# Patient Record
Sex: Female | Born: 1940 | Race: Black or African American | Hispanic: No | Marital: Married | Smoking: Never smoker
Health system: Southern US, Community
[De-identification: ages and names within clinical notes are randomized; demographics above are authoritative.]

## PROBLEM LIST (undated history)

## (undated) DIAGNOSIS — Z8669 Personal history of other diseases of the nervous system and sense organs: Secondary | ICD-10-CM

## (undated) DIAGNOSIS — E278 Other specified disorders of adrenal gland: Secondary | ICD-10-CM

## (undated) DIAGNOSIS — H409 Unspecified glaucoma: Secondary | ICD-10-CM

## (undated) DIAGNOSIS — E785 Hyperlipidemia, unspecified: Secondary | ICD-10-CM

## (undated) DIAGNOSIS — R7303 Prediabetes: Secondary | ICD-10-CM

## (undated) DIAGNOSIS — K219 Gastro-esophageal reflux disease without esophagitis: Secondary | ICD-10-CM

## (undated) DIAGNOSIS — E559 Vitamin D deficiency, unspecified: Secondary | ICD-10-CM

## (undated) DIAGNOSIS — E039 Hypothyroidism, unspecified: Secondary | ICD-10-CM

## (undated) DIAGNOSIS — E279 Disorder of adrenal gland, unspecified: Secondary | ICD-10-CM

## (undated) DIAGNOSIS — I10 Essential (primary) hypertension: Secondary | ICD-10-CM

## (undated) HISTORY — PX: ABDOMINAL HYSTERECTOMY: SHX81

## (undated) HISTORY — PX: THYROIDECTOMY: SHX17

## (undated) HISTORY — PX: EYE SURGERY: SHX253

## (undated) HISTORY — PX: CYSTECTOMY W/ CONTINENT DIVERSION: SUR360

## (undated) HISTORY — PX: BREAST BIOPSY: SHX20

---

## 2019-07-27 ENCOUNTER — Other Ambulatory Visit: Payer: Self-pay | Admitting: Infectious Diseases

## 2019-07-27 DIAGNOSIS — Z1231 Encounter for screening mammogram for malignant neoplasm of breast: Secondary | ICD-10-CM

## 2019-09-19 ENCOUNTER — Ambulatory Visit: Payer: Self-pay

## 2019-10-07 ENCOUNTER — Ambulatory Visit: Payer: Self-pay

## 2019-10-13 ENCOUNTER — Encounter: Payer: Self-pay | Admitting: Radiology

## 2019-10-13 ENCOUNTER — Ambulatory Visit
Admission: RE | Admit: 2019-10-13 | Discharge: 2019-10-13 | Disposition: A | Discharge status: Home / Self Care | Payer: Medicare Other | Source: Ambulatory Visit | Attending: Infectious Diseases | Admitting: Infectious Diseases

## 2019-10-13 DIAGNOSIS — Z1231 Encounter for screening mammogram for malignant neoplasm of breast: Secondary | ICD-10-CM | POA: Insufficient documentation

## 2019-10-21 ENCOUNTER — Ambulatory Visit: Payer: Medicare Other | Attending: Internal Medicine

## 2019-10-21 DIAGNOSIS — Z23 Encounter for immunization: Secondary | ICD-10-CM

## 2019-10-21 NOTE — Progress Notes (Signed)
   Covid-19 Vaccination Clinic  Name:  Ayriana Kladis    MRN: [id number] DOB: 1941/02/24  10/21/2019  Ms. Rosasco was observed post Covid-19 immunization for 15 minutes without incident. She was provided with Vaccine Information Sheet and instruction to access the V-Safe system.   Ms. Hartl was instructed to call 911 with any severe reactions post vaccine: Marland Kitchen Difficulty breathing  . Swelling of face and throat  . A fast heartbeat  . A bad rash all over body  . Dizziness and weakness   Immunizations Administered    Name Date Dose VIS Date Route   Pfizer COVID-19 Vaccine 10/21/2019  8:11 AM 0.3 mL 08/10/2018 Intramuscular   Manufacturer: [place]   Lot: [id number]   [place]: [id number]

## 2019-11-15 ENCOUNTER — Ambulatory Visit: Payer: Medicare Other | Attending: Internal Medicine

## 2019-11-15 DIAGNOSIS — Z23 Encounter for immunization: Secondary | ICD-10-CM

## 2019-11-15 NOTE — Progress Notes (Signed)
   Covid-19 Vaccination Clinic  Name:  Jaime Webster    MRN: [id number] DOB: 05-29-1941  11/15/2019  [name] was observed post Covid-19 immunization for 15 minutes without incident. She was provided with Vaccine Information Sheet and instruction to access the V-Safe system.   [name] was instructed to call 911 with any severe reactions post vaccine: Marland Kitchen Difficulty breathing  . Swelling of face and throat  . A fast heartbeat  . A bad rash all over body  . Dizziness and weakness   Immunizations Administered    Name Date Dose VIS Date Route   Pfizer COVID-19 Vaccine 11/15/2019  8:05 AM 0.3 mL 08/10/2018 Intramuscular   Manufacturer: [place]   Lot: [id number]   [place]: [id number]

## 2020-01-09 ENCOUNTER — Other Ambulatory Visit
Admission: RE | Admit: 2020-01-09 | Discharge: 2020-01-09 | Disposition: A | Discharge status: Home / Self Care | Payer: Medicare Other | Source: Ambulatory Visit | Attending: Internal Medicine | Admitting: Internal Medicine

## 2020-01-09 ENCOUNTER — Other Ambulatory Visit: Payer: Self-pay

## 2020-01-09 DIAGNOSIS — Z01812 Encounter for preprocedural laboratory examination: Secondary | ICD-10-CM | POA: Insufficient documentation

## 2020-01-09 DIAGNOSIS — Z20822 Contact with and (suspected) exposure to covid-19: Secondary | ICD-10-CM | POA: Diagnosis not present

## 2020-01-10 ENCOUNTER — Encounter: Payer: Self-pay | Admitting: Internal Medicine

## 2020-01-10 LAB — SARS CORONAVIRUS 2 (TAT 6-24 HRS): SARS Coronavirus 2: NEGATIVE

## 2020-01-11 ENCOUNTER — Encounter: Payer: Self-pay | Admitting: Internal Medicine

## 2020-01-11 ENCOUNTER — Ambulatory Visit: Payer: Medicare Other | Admitting: Anesthesiology

## 2020-01-11 ENCOUNTER — Other Ambulatory Visit: Payer: Self-pay

## 2020-01-11 ENCOUNTER — Ambulatory Visit
Admission: RE | Admit: 2020-01-11 | Discharge: 2020-01-11 | Disposition: A | Discharge status: Home / Self Care | Payer: Medicare Other | Attending: Internal Medicine | Admitting: Internal Medicine

## 2020-01-11 ENCOUNTER — Encounter
Admission: RE | Disposition: A | Discharge status: Home / Self Care | Payer: Self-pay | Source: Home / Self Care | Attending: Internal Medicine

## 2020-01-11 DIAGNOSIS — E785 Hyperlipidemia, unspecified: Secondary | ICD-10-CM | POA: Diagnosis not present

## 2020-01-11 DIAGNOSIS — H42 Glaucoma in diseases classified elsewhere: Secondary | ICD-10-CM | POA: Insufficient documentation

## 2020-01-11 DIAGNOSIS — Z888 Allergy status to other drugs, medicaments and biological substances status: Secondary | ICD-10-CM | POA: Insufficient documentation

## 2020-01-11 DIAGNOSIS — E039 Hypothyroidism, unspecified: Secondary | ICD-10-CM | POA: Insufficient documentation

## 2020-01-11 DIAGNOSIS — E1139 Type 2 diabetes mellitus with other diabetic ophthalmic complication: Secondary | ICD-10-CM | POA: Diagnosis not present

## 2020-01-11 DIAGNOSIS — I10 Essential (primary) hypertension: Secondary | ICD-10-CM | POA: Insufficient documentation

## 2020-01-11 DIAGNOSIS — Z1211 Encounter for screening for malignant neoplasm of colon: Secondary | ICD-10-CM | POA: Insufficient documentation

## 2020-01-11 DIAGNOSIS — E559 Vitamin D deficiency, unspecified: Secondary | ICD-10-CM | POA: Diagnosis not present

## 2020-01-11 DIAGNOSIS — D124 Benign neoplasm of descending colon: Secondary | ICD-10-CM | POA: Diagnosis not present

## 2020-01-11 DIAGNOSIS — K219 Gastro-esophageal reflux disease without esophagitis: Secondary | ICD-10-CM | POA: Insufficient documentation

## 2020-01-11 DIAGNOSIS — Z79899 Other long term (current) drug therapy: Secondary | ICD-10-CM | POA: Insufficient documentation

## 2020-01-11 HISTORY — DX: Gastro-esophageal reflux disease without esophagitis: K21.9

## 2020-01-11 HISTORY — DX: Hypothyroidism, unspecified: E03.9

## 2020-01-11 HISTORY — PX: COLONOSCOPY WITH PROPOFOL: SHX5780

## 2020-01-11 HISTORY — DX: Hyperlipidemia, unspecified: E78.5

## 2020-01-11 HISTORY — DX: Unspecified glaucoma: H40.9

## 2020-01-11 HISTORY — DX: Disorder of adrenal gland, unspecified: E27.9

## 2020-01-11 HISTORY — DX: Essential (primary) hypertension: I10

## 2020-01-11 HISTORY — DX: Other specified disorders of adrenal gland: E27.8

## 2020-01-11 HISTORY — DX: Personal history of other diseases of the nervous system and sense organs: Z86.69

## 2020-01-11 HISTORY — DX: Vitamin D deficiency, unspecified: E55.9

## 2020-01-11 HISTORY — DX: Prediabetes: R73.03

## 2020-01-11 SURGERY — COLONOSCOPY WITH PROPOFOL
Anesthesia: General

## 2020-01-11 MED ORDER — SODIUM CHLORIDE 0.9 % IV SOLN: INTRAVENOUS | Status: DC

## 2020-01-11 MED ORDER — PROPOFOL 500 MG/50ML IV EMUL
INTRAVENOUS | Status: DC | PRN
  Administered 2020-01-11: 150 ug/kg/min via INTRAVENOUS

## 2020-01-11 MED ORDER — PROPOFOL 500 MG/50ML IV EMUL
INTRAVENOUS | Status: AC
  Filled 2020-01-11: qty 50

## 2020-01-11 NOTE — Anesthesia Postprocedure Evaluation (Signed)
Anesthesia Post Note  Patient: Jaime Webster  Procedure(s) Performed: COLONOSCOPY WITH PROPOFOL (N/A )  Patient location during evaluation: Endoscopy Anesthesia Type: General Level of consciousness: awake and alert Pain management: pain level controlled Vital Signs Assessment: post-procedure vital signs reviewed and stable Respiratory status: spontaneous breathing, nonlabored ventilation, respiratory function stable and patient connected to nasal cannula oxygen Cardiovascular status: blood pressure returned to baseline and stable Postop Assessment: no apparent nausea or vomiting Anesthetic complications: no   No complications documented.   Last Vitals:  Vitals:   01/11/20 1313 01/11/20 1323  BP: 117/71 (!) 162/62  Pulse: 63 51  Resp: 15 14  Temp:    SpO2: 100% 100%    Last Pain:  Vitals:   01/11/20 1323  TempSrc:   PainSc: 0-No pain                 Martha Clan

## 2020-01-11 NOTE — Anesthesia Procedure Notes (Signed)
Performed by: Cook-Martin, Ethyle Tiedt Pre-anesthesia Checklist: Patient identified, Emergency Drugs available, Suction available, Patient being monitored and Timeout performed Patient Re-evaluated:Patient Re-evaluated prior to induction Oxygen Delivery Method: Nasal cannula Preoxygenation: Pre-oxygenation with 100% oxygen Induction Type: IV induction Placement Confirmation: positive ETCO2 and CO2 detector       

## 2020-01-11 NOTE — Op Note (Signed)
[facility] Gastroenterology Patient Name: Jaime Webster Procedure Date: 01/11/2020 12:42 PM MRN: [id number] Account #: [id number] Date of Birth: February 15, 1941 Admit Type: Outpatient Age: 79 Room: [facility] ENDO ROOM 3 Gender: Female Note Status: Finalized Procedure:             Colonoscopy Indications:           Screening for colorectal malignant neoplasm Providers:             Benay Pike. Alice Reichert MD, MD Referring MD:          Adrian Prows (Referring MD) Medicines:             Propofol per Anesthesia Complications:         No immediate complications. Procedure:             Pre-Anesthesia Assessment:                        - The risks and benefits of the procedure and the                         sedation options and risks were discussed with the                         patient. All questions were answered and informed                         consent was obtained.                        - Patient identification and proposed procedure were                         verified prior to the procedure by the nurse. The                         procedure was verified in the procedure room.                        - ASA Grade Assessment: III - A patient with severe                         systemic disease.                        - After reviewing the risks and benefits, the patient                         was deemed in satisfactory condition to undergo the                         procedure.                        After obtaining informed consent, the colonoscope was                         passed under direct vision. Throughout the procedure,                         the patient's blood pressure, pulse, and  oxygen                         saturations were monitored continuously. The                         Colonoscope was introduced through the anus and                         advanced to the the cecum, identified by appendiceal                         orifice and ileocecal valve.  The colonoscopy was                         performed without difficulty. The patient tolerated                         the procedure well. The quality of the bowel                         preparation was excellent. The ileocecal valve,                         appendiceal orifice, and rectum were photographed. Findings:      The perianal and digital rectal examinations were normal. Pertinent       negatives include normal sphincter tone and no palpable rectal lesions.      A 3 mm polyp was found in the descending colon. The polyp was sessile.       The polyp was removed with a cold biopsy forceps. Resection and       retrieval were complete.      The exam was otherwise without abnormality on direct and retroflexion       views. Impression:            - One 3 mm polyp in the descending colon, removed with                         a cold biopsy forceps. Resected and retrieved.                        - The examination was otherwise normal on direct and                         retroflexion views. Recommendation:        - Patient has a contact number available for                         emergencies. The signs and symptoms of potential                         delayed complications were discussed with the patient.                         Return to normal activities tomorrow. Written                         discharge instructions were provided to the patient.                        -  Resume previous diet.                        - Continue present medications.                        - Repeat colonoscopy is recommended for surveillance.                         The colonoscopy date will be determined after                         pathology results from today's exam become available                         for review.                        - Return to GI office PRN.                        - The findings and recommendations were discussed with                         the patient. Procedure  Code(s):     --- Professional ---                        [phone], Colonoscopy, flexible; with biopsy, single or                         multiple Diagnosis Code(s):     --- Professional ---                        K63.5, Polyp of colon                        Z12.11, Encounter for screening for malignant neoplasm                         of colon CPT copyright 2019 American Medical Association. All rights reserved. The codes documented in this report are preliminary and upon coder review may  be revised to meet current compliance requirements. Efrain Sella MD, MD 01/11/2020 1:00:46 PM This report has been signed electronically. Number of Addenda: 0 Note Initiated On: 01/11/2020 12:42 PM Scope Withdrawal Time: 0 hours 7 minutes 14 seconds  Total Procedure Duration: 0 hours 10 minutes 43 seconds  Estimated Blood Loss:  Estimated blood loss: none.      [facility]

## 2020-01-11 NOTE — Interval H&P Note (Signed)
History and Physical Interval Note:  01/11/2020 12:26 PM  Jaime Webster  has presented today for surgery, with the diagnosis of [place].  The various methods of treatment have been discussed with the patient and family. After consideration of risks, benefits and other options for treatment, the patient has consented to  Procedure(s): COLONOSCOPY WITH PROPOFOL (N/A) as a surgical intervention.  The patient's history has been reviewed, patient examined, no change in status, stable for surgery.  I have reviewed the patient's chart and labs.  Questions were answered to the patient's satisfaction.     [place], [place]

## 2020-01-11 NOTE — H&P (Signed)
Outpatient short stay form Pre-procedure 01/11/2020 12:26 PM Jiles Goya K. Alice Reichert, M.D.  Primary Physician: Adrian Prows, M.D.  Reason for visit:  Colon cancer screening  History of present illness: Patient presents for colonoscopy for colon cancer screening. The patient denies complaints of abdominal pain, significant change in bowel habits, or rectal bleeding.      Current Facility-Administered Medications:  .  0.9 %  sodium chloride infusion, , Intravenous, Continuous, [place], Benay Pike, MD, Last Rate: 20 mL/hr at 01/11/20 1215, Continued from Pre-op at 01/11/20 1215  Medications Prior to Admission  Medication Sig Dispense Refill Last Dose  . albuterol (VENTOLIN HFA) 108 (90 Base) MCG/ACT inhaler Inhale 1 puff into the lungs every 6 (six) hours as needed for wheezing or shortness of breath.   Past Month at Unknown time  . ascorbic acid (VITAMIN C) 500 MG tablet Take 500 mg by mouth daily.   Past Week at Unknown time  . aspirin EC 81 MG tablet Take 81 mg by mouth daily. Swallow whole.   Past Week at Unknown time  . cholecalciferol (VITAMIN D3) 10 MCG (400 UNIT) TABS tablet Take 400 Units by mouth daily.   Past Week at Unknown time  . dorzolamide-timolol (COSOPT) 22.3-6.8 MG/ML ophthalmic solution Place 1 drop into both eyes 2 (two) times daily.   01/10/2020 at 0800  . hydrochlorothiazide (HYDRODIURIL) 25 MG tablet Take 25 mg by mouth daily.   01/11/2020 at 0700  . latanoprost (XALATAN) 0.005 % ophthalmic solution Place 1 drop into both eyes at bedtime.   01/10/2020 at Unknown time  . levothyroxine (SYNTHROID) 150 MCG tablet Take 150 mcg by mouth daily before breakfast.   01/10/2020 at Unknown time  . losartan (COZAAR) 100 MG tablet Take 100 mg by mouth daily.   01/11/2020 at 0700  . metoprolol succinate (TOPROL-XL) 50 MG 24 hr tablet Take 50 mg by mouth daily. Take with or immediately following a meal.   01/11/2020 at 0700  . Multiple Vitamin (MULTIVITAMIN) tablet Take 1 tablet by mouth  daily.   Past Week at Unknown time  . sodium chloride (OCEAN) 0.65 % nasal spray Place 1 spray into the nose as needed for congestion.   Past Week at Unknown time  . triamcinolone ointment (KENALOG) 0.1 % Apply 1 application topically 2 (two) times daily.   Past Week at Unknown time     Allergies  Allergen Reactions  . Statins Other (See Comments)    MUSCLE PAIN     Past Medical History:  Diagnosis Date  . Adrenal nodule ([place])   . GERD (gastroesophageal reflux disease)   . Glaucoma   . H/O cataract   . Hyperlipidemia   . Hypertension   . Hypothyroidism   . Pre-diabetes   . Vitamin D deficiency     Review of systems:  Otherwise negative.    Physical Exam  Gen: Alert, oriented. Appears stated age.  HEENT: [place]/AT. PERRLA. Lungs: CTA, no wheezes. CV: RR nl S1, S2. Abd: soft, benign, no masses. BS+ Ext: No edema. Pulses 2+    Planned procedures: Proceed with colonoscopy. The patient understands the nature of the planned procedure, indications, risks, alternatives and potential complications including but not limited to bleeding, infection, perforation, damage to internal organs and possible oversedation/side effects from anesthesia. The patient agrees and gives consent to proceed.  Please refer to procedure notes for findings, recommendations and patient disposition/instructions.     Kirsta Probert K. Alice Reichert, M.D. Gastroenterology 01/11/2020  12:26 PM

## 2020-01-11 NOTE — Anesthesia Preprocedure Evaluation (Signed)
Anesthesia Evaluation  Patient identified by MRN, date of birth, ID band Patient awake    Reviewed: Allergy & Precautions, H&P , NPO status , Patient's Chart, lab work & pertinent test results, reviewed documented beta blocker date and time   History of Anesthesia Complications Negative for: history of anesthetic complications  Airway Mallampati: II  TM Distance: >3 FB Neck ROM: full    Dental  (+) Dental Advidsory Given, Partial Upper, Teeth Intact   Pulmonary neg pulmonary ROS,    Pulmonary exam normal breath sounds clear to auscultation       Cardiovascular Exercise Tolerance: Good hypertension, (-) angina(-) Past MI and (-) Cardiac Stents Normal cardiovascular exam(-) dysrhythmias + Valvular Problems/Murmurs MVP  Rhythm:regular Rate:Normal     Neuro/Psych negative neurological ROS  negative psych ROS   GI/Hepatic Neg liver ROS, GERD  Controlled,  Endo/Other  diabetes (borderline)Hypothyroidism   Renal/GU negative Renal ROS  negative genitourinary   Musculoskeletal   Abdominal   Peds  Hematology negative hematology ROS (+)   Anesthesia Other Findings Past Medical History: No date: Adrenal nodule (HCC) No date: GERD (gastroesophageal reflux disease) No date: Glaucoma No date: H/O cataract No date: Hyperlipidemia No date: Hypertension No date: Hypothyroidism No date: Pre-diabetes No date: Vitamin D deficiency   Reproductive/Obstetrics negative OB ROS                             Anesthesia Physical Anesthesia Plan  ASA: II  Anesthesia Plan: General   Post-op Pain Management:    Induction: Intravenous  PONV Risk Score and Plan: 3 and Propofol infusion and TIVA  Airway Management Planned: Nasal Cannula and Natural Airway  Additional Equipment:   Intra-op Plan:   Post-operative Plan:   Informed Consent: I have reviewed the patients History and Physical, chart, labs and  discussed the procedure including the risks, benefits and alternatives for the proposed anesthesia with the patient or authorized representative who has indicated his/her understanding and acceptance.     Dental Advisory Given  Plan Discussed with: Anesthesiologist, CRNA and Surgeon  Anesthesia Plan Comments:         Anesthesia Quick Evaluation

## 2020-01-11 NOTE — Transfer of Care (Signed)
Immediate Anesthesia Transfer of Care Note  Patient: Jaime Webster  Procedure(s) Performed: COLONOSCOPY WITH PROPOFOL (N/A )  Patient Location: PACU  Anesthesia Type:General  Level of Consciousness: awake and sedated  Airway & Oxygen Therapy: Patient Spontanous Breathing and Patient connected to nasal cannula oxygen  Post-op Assessment: Report given to RN and Post -op Vital signs reviewed and stable  Post vital signs: Reviewed and stable  Last Vitals:  Vitals Value Taken Time  BP    Temp    Pulse    Resp    SpO2      Last Pain:  Vitals:   01/11/20 1201  PainSc: 0-No pain         Complications: No complications documented.

## 2020-01-12 ENCOUNTER — Encounter: Payer: Self-pay | Admitting: Internal Medicine

## 2020-01-12 LAB — SURGICAL PATHOLOGY

## 2020-06-19 ENCOUNTER — Ambulatory Visit: Payer: Medicare Other | Attending: Internal Medicine

## 2020-06-19 DIAGNOSIS — Z23 Encounter for immunization: Secondary | ICD-10-CM

## 2020-06-19 NOTE — Progress Notes (Signed)
   Covid-19 Vaccination Clinic  Name:  Jaime Webster    MRN: [id number] DOB: 01/09/1941  06/19/2020  Jaime Webster was observed post Covid-19 immunization for 15 minutes without incident. She was provided with Vaccine Information Sheet and instruction to access the V-Safe system.   Jaime Webster was instructed to call 911 with any severe reactions post vaccine: . Difficulty breathing  . Swelling of face and throat  . A fast heartbeat  . A bad rash all over body  . Dizziness and weakness   Immunizations Administered    Name Date Dose VIS Date Route   Pfizer COVID-19 Vaccine 06/19/2020  2:15 PM 0.3 mL 04/04/2020 Intramuscular   Manufacturer: Pfizer, Inc   Lot: [id number]   [place]: [address]-2     

## 2020-06-19 NOTE — Progress Notes (Signed)
   Covid-19 Vaccination Clinic  Name:  CRESSIDA MILFORD    MRN: [id number] DOB: Dec 09, 1940  06/19/2020  Ms. Hottle was observed post Covid-19 immunization for 15 minutes without incident. She was provided with Vaccine Information Sheet and instruction to access the V-Safe system.   Ms. Justen was instructed to call 911 with any severe reactions post vaccine: Marland Kitchen Difficulty breathing  . Swelling of face and throat  . A fast heartbeat  . A bad rash all over body  . Dizziness and weakness   Immunizations Administered    Name Date Dose VIS Date Route   Pfizer COVID-19 Vaccine 06/19/2020  2:15 PM 0.3 mL 04/04/2020 Intramuscular   Manufacturer: [employer]   Lot: [id number]   [place]C: [address]-0

## 2020-09-03 ENCOUNTER — Other Ambulatory Visit: Payer: Self-pay | Admitting: Infectious Diseases

## 2020-09-26 ENCOUNTER — Other Ambulatory Visit: Payer: Self-pay | Admitting: Infectious Diseases

## 2020-09-26 DIAGNOSIS — Z1231 Encounter for screening mammogram for malignant neoplasm of breast: Secondary | ICD-10-CM

## 2020-12-06 ENCOUNTER — Other Ambulatory Visit: Payer: Self-pay

## 2020-12-06 DIAGNOSIS — N644 Mastodynia: Secondary | ICD-10-CM

## 2020-12-18 ENCOUNTER — Ambulatory Visit
Admission: RE | Admit: 2020-12-18 | Discharge: 2020-12-18 | Disposition: A | Discharge status: Home / Self Care | Payer: Medicare Other | Source: Ambulatory Visit

## 2020-12-18 ENCOUNTER — Other Ambulatory Visit: Payer: Self-pay

## 2020-12-18 DIAGNOSIS — N644 Mastodynia: Secondary | ICD-10-CM | POA: Insufficient documentation

## 2021-01-04 ENCOUNTER — Ambulatory Visit: Payer: Medicare Other

## 2021-01-15 ENCOUNTER — Ambulatory Visit: Payer: Medicare Other | Attending: Internal Medicine

## 2021-01-15 ENCOUNTER — Other Ambulatory Visit: Payer: Self-pay

## 2021-01-15 DIAGNOSIS — Z23 Encounter for immunization: Secondary | ICD-10-CM

## 2021-01-15 MED ORDER — COVID-19 MRNA VAC-TRIS(PFIZER) 30 MCG/0.3ML IM SUSP
INTRAMUSCULAR | 0 refills | Status: AC
  Filled 2021-01-15: qty 0.3, 1d supply, fill #0

## 2021-01-15 NOTE — Progress Notes (Addendum)
   Covid-19 Vaccination Clinic  Name:  Jaime Webster    MRN: [id number] DOB: [birth year]  01/15/2021  [name] was observed post Covid-19 immunization for 15 minutes without incident. She was provided with Vaccine Information Sheet and instruction to access the V-Safe system.   [name] was instructed to call 911 with any severe reactions post vaccine: Difficulty breathing  Swelling of face and throat  A fast heartbeat  A bad rash all over body  Dizziness and weakness   Immunizations Administered     Name Date Dose VIS Date Route   PFIZER Comrnaty([place] TOP) Covid-19 Vaccine 01/15/2021  2:11 PM 0.3 mL 05/24/2020 Intramuscular   Manufacturer: [place]   Lot: [id number]   [place]: [place], PharmD, MBA Clinical Acute Care Pharmacist

## 2021-02-01 IMAGING — MG DIGITAL SCREENING BILAT W/ TOMO W/ CAD
6 of 10 series · 6 of 30 positions shown · non-contrast
Comparison: Previous exam(s).

CLINICAL DATA: Screening.

EXAM:
DIGITAL SCREENING BILATERAL MAMMOGRAM WITH TOMO AND CAD

[R CC synth-2D (1 of 2)]
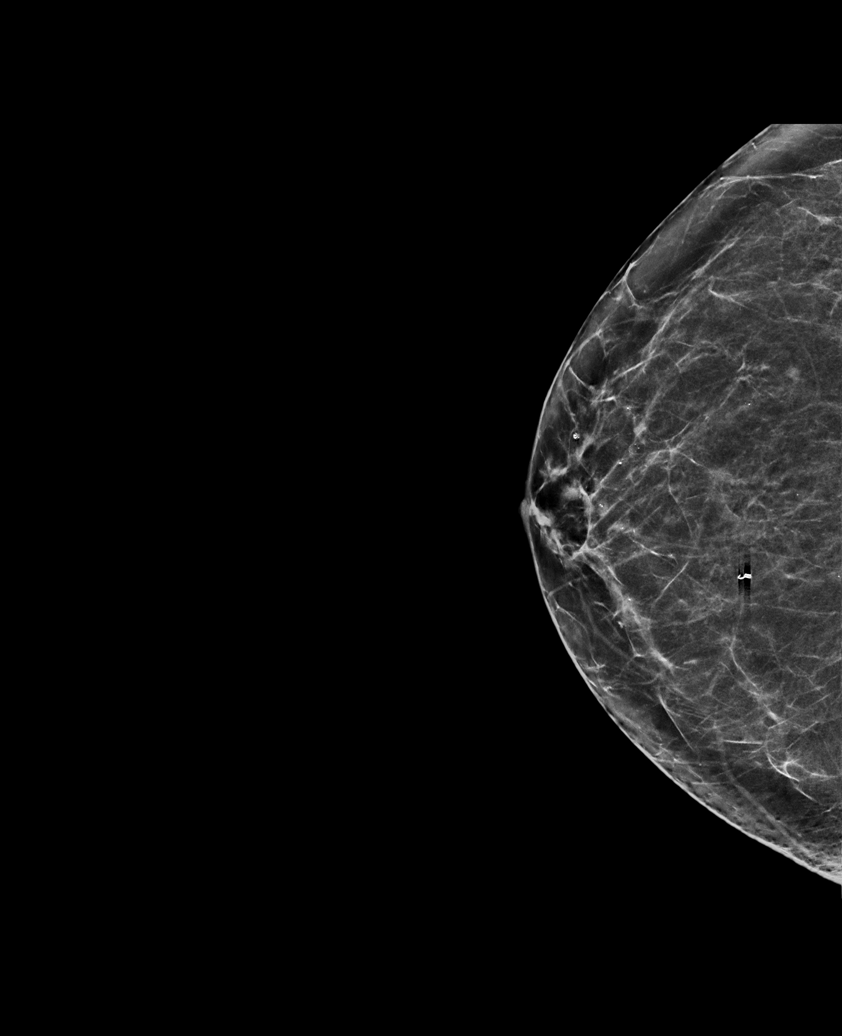

[R MLO synth-2D]
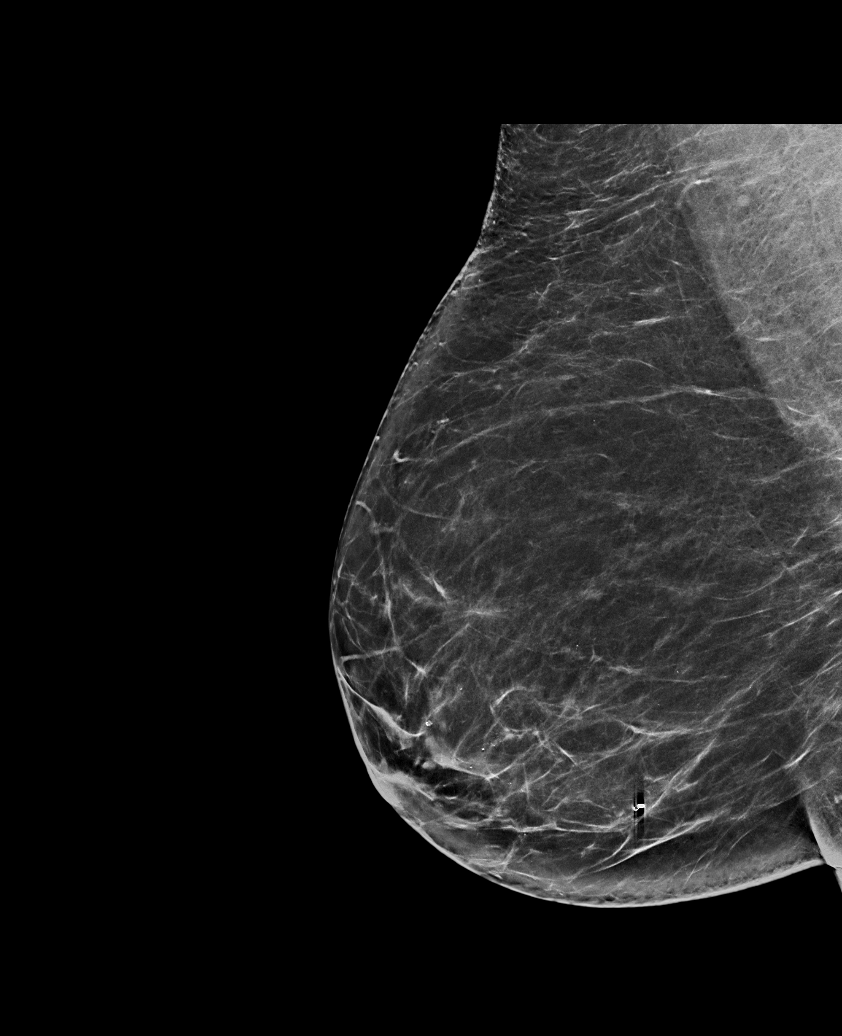

[R CC synth-2D (2 of 2)]
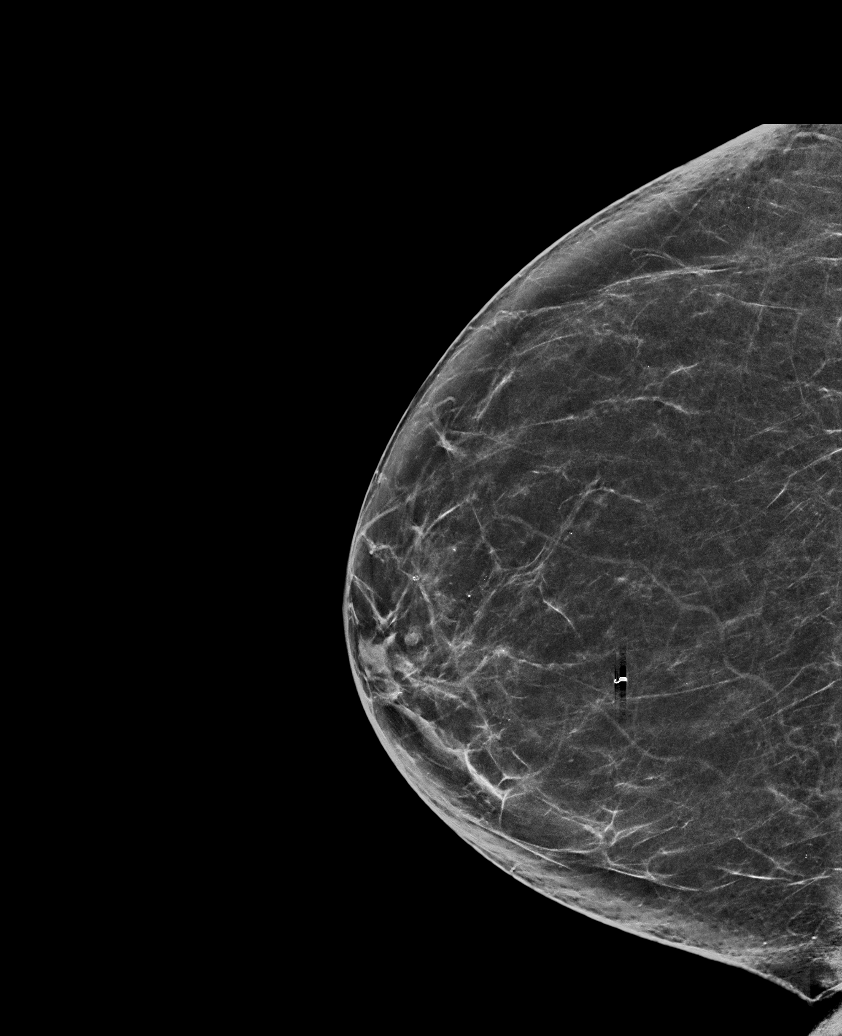

[L CC synth-2D]
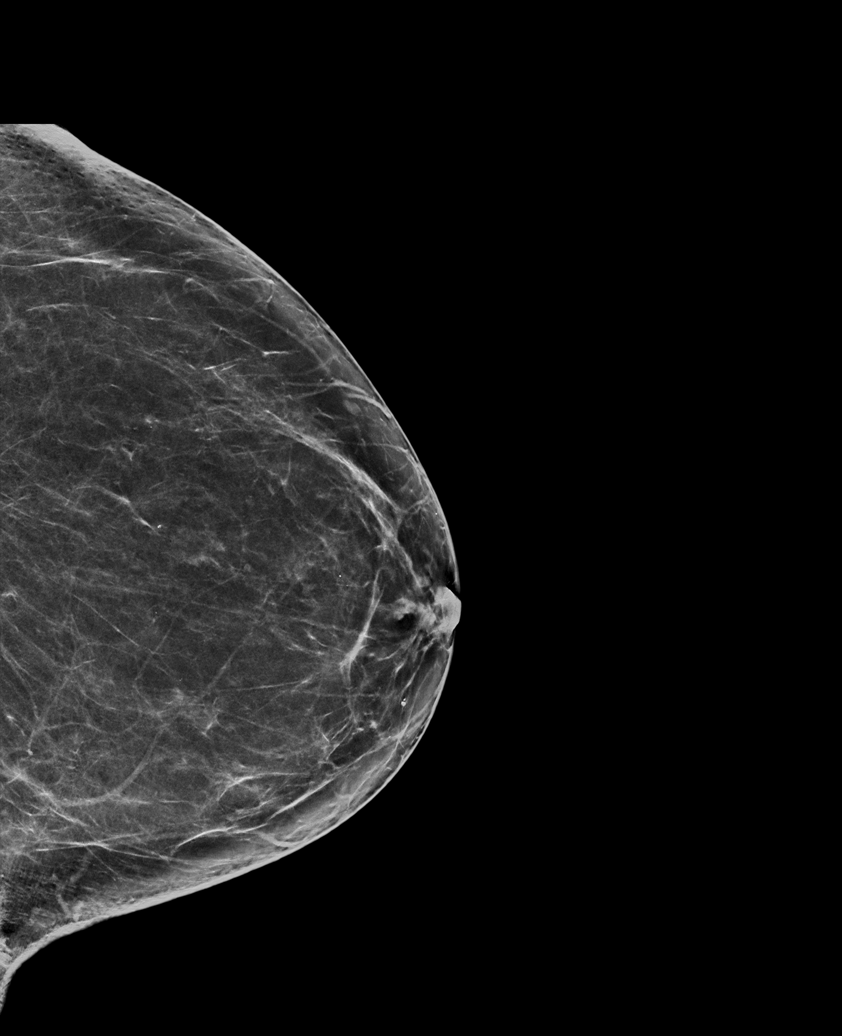

[L MLO synth-2D]
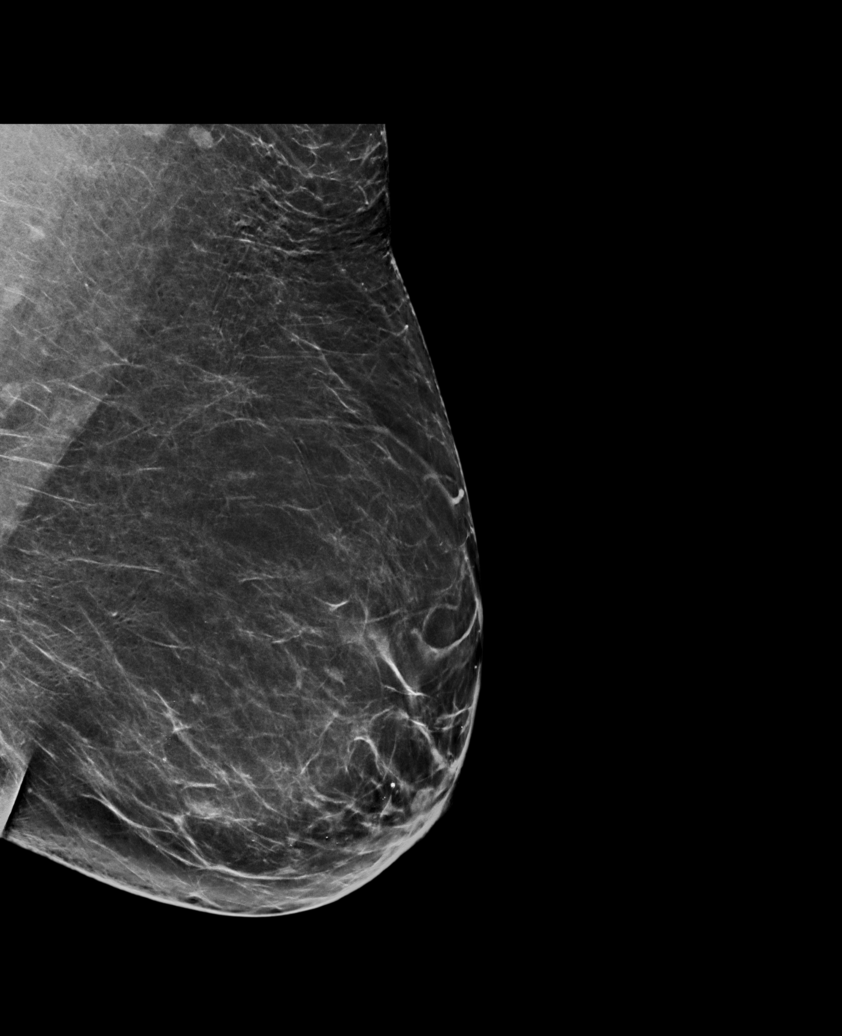

[R CC tomo · tomo slice 37/72.0]
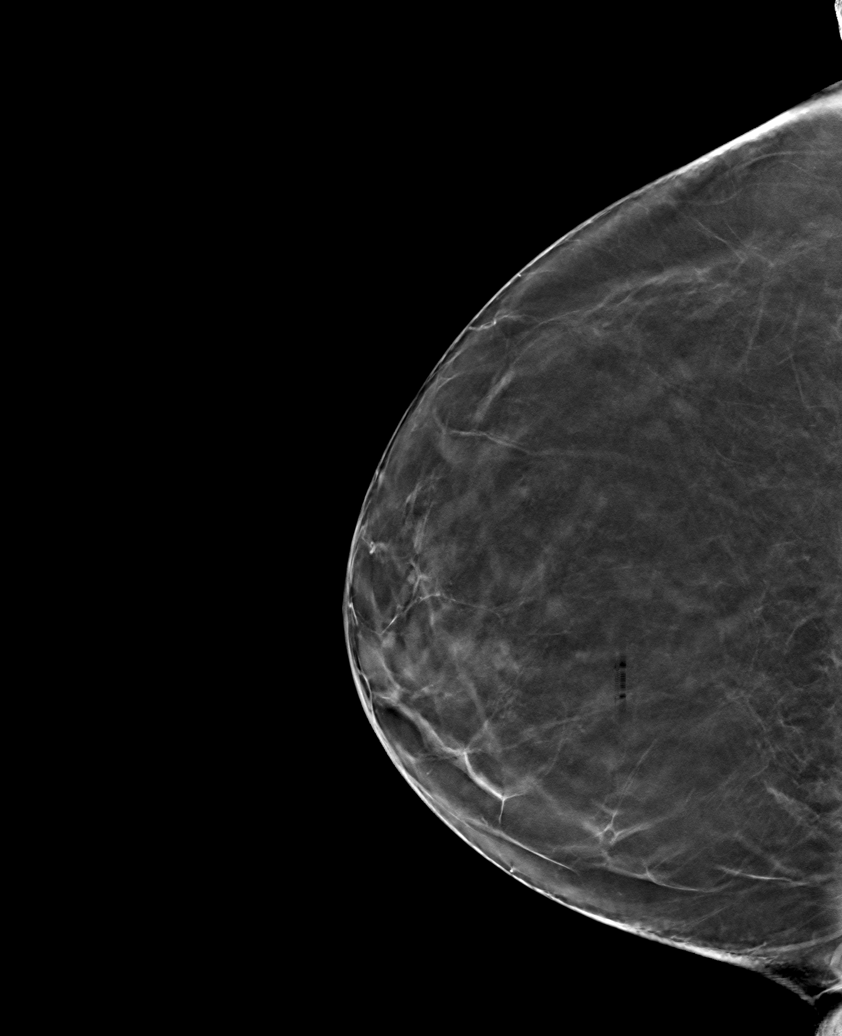

[6 of 30 positions shown; findings below may reference images not displayed]

ACR Breast Density Category b: There are scattered areas of
fibroglandular density.
FINDINGS: There are no findings suspicious for malignancy. Images were
processed with CAD.
IMPRESSION: No mammographic evidence of malignancy. A result letter of this
screening mammogram will be mailed directly to the patient.

RECOMMENDATION:
Screening mammogram in one year. (Code:[id number]-775)

BI-RADS CATEGORY  1: Negative.

## 2021-04-18 ENCOUNTER — Ambulatory Visit: Payer: Medicare Other | Attending: Internal Medicine

## 2021-04-18 ENCOUNTER — Other Ambulatory Visit: Payer: Self-pay

## 2021-04-18 DIAGNOSIS — Z23 Encounter for immunization: Secondary | ICD-10-CM

## 2021-04-18 MED ORDER — PFIZER COVID-19 VAC BIVALENT 30 MCG/0.3ML IM SUSP
INTRAMUSCULAR | 0 refills | Status: AC
  Filled 2021-04-18: qty 0.3, 1d supply, fill #0

## 2021-04-18 NOTE — Progress Notes (Signed)
   Covid-19 Vaccination Clinic  Name:  Jaime Webster    MRN: [id number] DOB: [birth year]  04/18/2021  Ms. Jaime Webster was observed post Covid-19 immunization for 15 minutes without incident. She was provided with Vaccine Information Sheet and instruction to access the V-Safe system.   Ms. Jaime Webster was instructed to call 911 with any severe reactions post vaccine: Difficulty breathing  Swelling of face and throat  A fast heartbeat  A bad rash all over body  Dizziness and weakness   Immunizations Administered     Name Date Dose VIS Date Route   Pfizer Covid-19 Vaccine Bivalent Booster 04/18/2021  1:12 PM 0.3 mL 02/13/2021 Intramuscular   Manufacturer: [place]   Lot: [id number]   [place]: [phone]

## 2021-08-28 ENCOUNTER — Other Ambulatory Visit: Payer: Self-pay | Admitting: Infectious Diseases

## 2021-08-28 DIAGNOSIS — M7989 Other specified soft tissue disorders: Secondary | ICD-10-CM

## 2021-09-02 ENCOUNTER — Ambulatory Visit
Admission: RE | Admit: 2021-09-02 | Discharge: 2021-09-02 | Disposition: A | Discharge status: Home / Self Care | Payer: Medicare Other | Source: Ambulatory Visit | Attending: Infectious Diseases | Admitting: Infectious Diseases

## 2021-09-02 ENCOUNTER — Other Ambulatory Visit: Payer: Self-pay

## 2021-09-02 DIAGNOSIS — M7989 Other specified soft tissue disorders: Secondary | ICD-10-CM | POA: Insufficient documentation

## 2021-11-15 ENCOUNTER — Other Ambulatory Visit: Payer: Self-pay

## 2021-11-15 ENCOUNTER — Other Ambulatory Visit: Payer: Self-pay | Admitting: Infectious Diseases

## 2021-11-15 DIAGNOSIS — Z1231 Encounter for screening mammogram for malignant neoplasm of breast: Secondary | ICD-10-CM

## 2022-03-04 ENCOUNTER — Ambulatory Visit
Admission: RE | Admit: 2022-03-04 | Discharge: 2022-03-04 | Disposition: A | Discharge status: Home / Self Care | Payer: Medicare Other | Source: Ambulatory Visit | Attending: Infectious Diseases | Admitting: Infectious Diseases

## 2022-03-04 DIAGNOSIS — Z1231 Encounter for screening mammogram for malignant neoplasm of breast: Secondary | ICD-10-CM | POA: Diagnosis present

## 2022-03-12 ENCOUNTER — Other Ambulatory Visit: Payer: Self-pay | Admitting: Neurology

## 2022-03-12 ENCOUNTER — Other Ambulatory Visit (HOSPITAL_COMMUNITY): Payer: Self-pay | Admitting: Neurology

## 2022-03-12 DIAGNOSIS — R42 Dizziness and giddiness: Secondary | ICD-10-CM

## 2022-04-15 ENCOUNTER — Ambulatory Visit (HOSPITAL_COMMUNITY): Payer: Medicare Other

## 2022-04-20 ENCOUNTER — Ambulatory Visit (HOSPITAL_COMMUNITY)
Admission: RE | Admit: 2022-04-20 | Discharge: 2022-04-20 | Disposition: A | Discharge status: Home / Self Care | Payer: Medicare Other | Source: Ambulatory Visit | Attending: Neurology | Admitting: Neurology

## 2022-04-20 DIAGNOSIS — R42 Dizziness and giddiness: Secondary | ICD-10-CM | POA: Insufficient documentation

## 2022-04-20 MED ORDER — GADOBUTROL 1 MMOL/ML IV SOLN
8.8000 mL | Freq: Once | INTRAVENOUS | Status: AC | PRN
  Administered 2022-04-20: 8.8 mL via INTRAVENOUS

## 2023-04-03 ENCOUNTER — Other Ambulatory Visit: Payer: Self-pay | Admitting: Infectious Diseases

## 2023-04-03 DIAGNOSIS — Z1231 Encounter for screening mammogram for malignant neoplasm of breast: Secondary | ICD-10-CM

## 2023-04-09 ENCOUNTER — Ambulatory Visit
Admission: RE | Admit: 2023-04-09 | Discharge: 2023-04-09 | Disposition: A | Discharge status: Home / Self Care | Payer: Medicare Other | Source: Ambulatory Visit | Attending: Infectious Diseases | Admitting: Infectious Diseases

## 2023-04-09 DIAGNOSIS — Z1231 Encounter for screening mammogram for malignant neoplasm of breast: Secondary | ICD-10-CM | POA: Diagnosis present

## 2023-05-08 ENCOUNTER — Other Ambulatory Visit: Payer: Self-pay | Admitting: Infectious Diseases

## 2023-05-08 ENCOUNTER — Ambulatory Visit
Admission: RE | Admit: 2023-05-08 | Discharge: 2023-05-08 | Disposition: A | Discharge status: Home / Self Care | Payer: Medicare Other | Source: Ambulatory Visit | Attending: Infectious Diseases | Admitting: Infectious Diseases

## 2023-05-08 DIAGNOSIS — M79604 Pain in right leg: Secondary | ICD-10-CM | POA: Diagnosis present

## 2023-11-26 ENCOUNTER — Other Ambulatory Visit: Payer: Self-pay

## 2023-11-26 ENCOUNTER — Ambulatory Visit: Attending: Obstetrics and Gynecology

## 2023-11-26 DIAGNOSIS — N3941 Urge incontinence: Secondary | ICD-10-CM | POA: Insufficient documentation

## 2023-11-26 DIAGNOSIS — R102 Pelvic and perineal pain: Secondary | ICD-10-CM | POA: Diagnosis present

## 2023-11-26 DIAGNOSIS — M6281 Muscle weakness (generalized): Secondary | ICD-10-CM | POA: Insufficient documentation

## 2023-11-26 DIAGNOSIS — R2689 Other abnormalities of gait and mobility: Secondary | ICD-10-CM | POA: Diagnosis present
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
## 2023-11-26 NOTE — Patient Instructions (Signed)

## 2023-12-01 ENCOUNTER — Ambulatory Visit

## 2023-12-01 ENCOUNTER — Other Ambulatory Visit: Payer: Self-pay

## 2023-12-01 DIAGNOSIS — M6281 Muscle weakness (generalized): Secondary | ICD-10-CM

## 2023-12-01 DIAGNOSIS — R102 Pelvic and perineal pain: Secondary | ICD-10-CM

## 2023-12-01 DIAGNOSIS — N3941 Urge incontinence: Secondary | ICD-10-CM

## 2023-12-01 DIAGNOSIS — R2689 Other abnormalities of gait and mobility: Secondary | ICD-10-CM
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
## 2023-12-17 ENCOUNTER — Ambulatory Visit: Attending: Obstetrics and Gynecology

## 2023-12-17 ENCOUNTER — Other Ambulatory Visit: Payer: Self-pay

## 2023-12-17 DIAGNOSIS — R2689 Other abnormalities of gait and mobility: Secondary | ICD-10-CM | POA: Insufficient documentation

## 2023-12-17 DIAGNOSIS — M6281 Muscle weakness (generalized): Secondary | ICD-10-CM | POA: Insufficient documentation

## 2023-12-17 DIAGNOSIS — R102 Pelvic and perineal pain: Secondary | ICD-10-CM | POA: Diagnosis present

## 2023-12-17 DIAGNOSIS — N3941 Urge incontinence: Secondary | ICD-10-CM | POA: Insufficient documentation
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
## 2023-12-22 ENCOUNTER — Encounter

## 2024-02-02 ENCOUNTER — Encounter

## 2024-02-04 ENCOUNTER — Encounter

## 2024-02-09 ENCOUNTER — Ambulatory Visit

## 2024-02-16 ENCOUNTER — Ambulatory Visit: Attending: Obstetrics and Gynecology

## 2024-02-16 ENCOUNTER — Other Ambulatory Visit: Payer: Self-pay

## 2024-02-16 DIAGNOSIS — R102 Pelvic and perineal pain: Secondary | ICD-10-CM | POA: Insufficient documentation

## 2024-02-16 DIAGNOSIS — M6281 Muscle weakness (generalized): Secondary | ICD-10-CM | POA: Insufficient documentation

## 2024-02-16 DIAGNOSIS — R2689 Other abnormalities of gait and mobility: Secondary | ICD-10-CM | POA: Insufficient documentation

## 2024-02-16 DIAGNOSIS — N3941 Urge incontinence: Secondary | ICD-10-CM | POA: Insufficient documentation

## 2024-02-16 NOTE — Patient Instructions (Signed)
Thoracic standing extension: with hands on counter, knees slightly bent, flatten back and neck in neutral (hips at 90 degrees). Breathe into back 5 reps, you can also moves hands to one side and breathe into opposite side and repeat on other side.
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
## 2024-02-23 ENCOUNTER — Encounter

## 2024-03-03 ENCOUNTER — Ambulatory Visit

## 2024-03-03 ENCOUNTER — Other Ambulatory Visit: Payer: Self-pay

## 2024-03-03 DIAGNOSIS — N3941 Urge incontinence: Secondary | ICD-10-CM | POA: Diagnosis not present

## 2024-03-03 DIAGNOSIS — R2689 Other abnormalities of gait and mobility: Secondary | ICD-10-CM

## 2024-03-03 DIAGNOSIS — R102 Pelvic and perineal pain: Secondary | ICD-10-CM

## 2024-03-03 DIAGNOSIS — M6281 Muscle weakness (generalized): Secondary | ICD-10-CM
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
## 2024-03-07 ENCOUNTER — Other Ambulatory Visit: Payer: Self-pay | Admitting: Infectious Diseases

## 2024-03-07 DIAGNOSIS — Z1231 Encounter for screening mammogram for malignant neoplasm of breast: Secondary | ICD-10-CM

## 2024-03-15 ENCOUNTER — Ambulatory Visit

## 2024-03-22 ENCOUNTER — Ambulatory Visit

## 2024-03-24 ENCOUNTER — Ambulatory Visit

## 2024-03-29 ENCOUNTER — Other Ambulatory Visit: Payer: Self-pay

## 2024-03-29 ENCOUNTER — Ambulatory Visit: Attending: Obstetrics and Gynecology

## 2024-03-29 DIAGNOSIS — R2689 Other abnormalities of gait and mobility: Secondary | ICD-10-CM | POA: Insufficient documentation

## 2024-03-29 DIAGNOSIS — M6281 Muscle weakness (generalized): Secondary | ICD-10-CM | POA: Diagnosis present

## 2024-03-29 DIAGNOSIS — N3941 Urge incontinence: Secondary | ICD-10-CM | POA: Insufficient documentation

## 2024-03-29 DIAGNOSIS — R102 Pelvic and perineal pain unspecified side: Secondary | ICD-10-CM | POA: Insufficient documentation
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
## 2024-03-29 NOTE — Patient Instructions (Addendum)
 Urogynecologists specialize in pelvic floor, continence, prolapse.  Bowel movements: protein and fiber help keep stool together and then squatty potty with relaxing inhale and then exhale with ohm deep sound.

## 2024-04-05 ENCOUNTER — Ambulatory Visit

## 2024-04-11 ENCOUNTER — Ambulatory Visit
Admission: RE | Admit: 2024-04-11 | Discharge: 2024-04-11 | Disposition: A | Discharge status: Home / Self Care | Source: Ambulatory Visit | Attending: Infectious Diseases | Admitting: Infectious Diseases

## 2024-04-11 DIAGNOSIS — Z1231 Encounter for screening mammogram for malignant neoplasm of breast: Secondary | ICD-10-CM | POA: Insufficient documentation

## 2024-04-12 ENCOUNTER — Ambulatory Visit

## 2024-04-19 ENCOUNTER — Ambulatory Visit

## 2024-04-26 ENCOUNTER — Ambulatory Visit

## 2024-04-26 ENCOUNTER — Other Ambulatory Visit: Payer: Self-pay | Admitting: Internal Medicine

## 2024-04-26 DIAGNOSIS — I10 Essential (primary) hypertension: Secondary | ICD-10-CM

## 2024-04-26 DIAGNOSIS — I341 Nonrheumatic mitral (valve) prolapse: Secondary | ICD-10-CM

## 2024-04-26 DIAGNOSIS — I2089 Other forms of angina pectoris: Secondary | ICD-10-CM

## 2024-04-27 ENCOUNTER — Telehealth (HOSPITAL_COMMUNITY): Payer: Self-pay | Admitting: Emergency Medicine

## 2024-04-27 ENCOUNTER — Encounter (HOSPITAL_COMMUNITY): Payer: Self-pay

## 2024-04-27 NOTE — Telephone Encounter (Signed)
 Attempted to call patient regarding upcoming cardiac CT appointment. Left message on voicemail with name and callback number Rockwell Alexandria RN Navigator Cardiac [facility] [phone] Office [phone] Cell

## 2024-04-28 ENCOUNTER — Ambulatory Visit
Admission: RE | Admit: 2024-04-28 | Discharge: 2024-04-28 | Disposition: A | Discharge status: Home / Self Care | Source: Ambulatory Visit | Attending: Internal Medicine | Admitting: Internal Medicine

## 2024-04-28 ENCOUNTER — Ambulatory Visit

## 2024-04-28 DIAGNOSIS — I341 Nonrheumatic mitral (valve) prolapse: Secondary | ICD-10-CM | POA: Diagnosis present

## 2024-04-28 DIAGNOSIS — I10 Essential (primary) hypertension: Secondary | ICD-10-CM | POA: Insufficient documentation

## 2024-04-28 DIAGNOSIS — I2089 Other forms of angina pectoris: Secondary | ICD-10-CM | POA: Insufficient documentation

## 2024-04-28 LAB — POCT I-STAT CREATININE: Creatinine, Ser: 0.9 mg/dL (ref 0.44–1.00)

## 2024-04-28 MED ORDER — NITROGLYCERIN 0.4 MG SL SUBL
0.8000 mg | SUBLINGUAL_TABLET | Freq: Once | SUBLINGUAL | Status: AC
  Administered 2024-04-28: 0.8 mg via SUBLINGUAL
  Filled 2024-04-28: qty 25

## 2024-04-28 MED ORDER — IOHEXOL 350 MG/ML SOLN
100.0000 mL | Freq: Once | INTRAVENOUS | Status: AC | PRN
  Administered 2024-04-28: 100 mL via INTRAVENOUS

## 2024-04-28 NOTE — Progress Notes (Signed)
 Patient tolerated CT well. Vital signs stable encourage to drink water throughout day.Reasons explained and verbalized understanding. Ambulated steady gait.

## 2024-05-09 ENCOUNTER — Ambulatory Visit (INDEPENDENT_AMBULATORY_CARE_PROVIDER_SITE_OTHER): Admitting: Urology

## 2024-05-09 VITALS — BP 191/83 | HR 76 | Ht 66.0 in | Wt 186.0 lb

## 2024-05-09 DIAGNOSIS — R3989 Other symptoms and signs involving the genitourinary system: Secondary | ICD-10-CM | POA: Diagnosis not present

## 2024-05-09 DIAGNOSIS — N3941 Urge incontinence: Secondary | ICD-10-CM

## 2024-05-09 LAB — URINALYSIS, COMPLETE
Bilirubin, UA: NEGATIVE
Glucose, UA: NEGATIVE
Ketones, UA: NEGATIVE
Nitrite, UA: NEGATIVE
Protein,UA: NEGATIVE
Specific Gravity, UA: 1.01 (ref 1.005–1.030)
Urobilinogen, Ur: 0.2 mg/dL (ref 0.2–1.0)
pH, UA: 6 (ref 5.0–7.5)

## 2024-05-09 LAB — MICROSCOPIC EXAMINATION: Epithelial Cells (non renal): >10 /HPF — AB (ref 0–10)

## 2024-05-09 MED ORDER — GEMTESA 75 MG PO TABS: 75.0000 mg | ORAL_TABLET | Freq: Every day | ORAL | 11 refills | Status: AC

## 2024-05-09 MED ORDER — GEMTESA 75 MG PO TABS: 75.0000 mg | ORAL_TABLET | Freq: Every day | ORAL | Status: AC

## 2024-05-09 NOTE — Progress Notes (Signed)
 05/09/2024 1:46 PM   Jaime Webster 1940-07-19 [id number]  Referring provider: Suzzane Charleston, PA-C [address] [facility] [place],  [place] [address]  Chief Complaint  Patient presents with   Establish Care   New Patient (Initial Visit)   Urinary Incontinence    HPI: I was consulted to assess the patient's urinary incontinence.  She primarily has urge incontinence.  She has moderately severe bedwetting.  She wears 1 pad a day moderately wet.  Physical therapy is helping  She voids every 2-3 hours gets up 4 times at night.  She drinks a lot of fluid late the day.  Flow was varying  Patient has had a hysterectomy and is a borderline diabetic  No history of kidney stones bladder surgery or bladder infections.  No previous treatment   PMH: Past Medical History:  Diagnosis Date   Adrenal nodule    GERD (gastroesophageal reflux disease)    Glaucoma    H/O cataract    Hyperlipidemia    Hypertension    Hypothyroidism    Pre-diabetes    Vitamin D deficiency     Surgical History: Past Surgical History:  Procedure Laterality Date   ABDOMINAL HYSTERECTOMY     BREAST BIOPSY Right    benign-pt can not remember year   COLONOSCOPY WITH PROPOFOL  N/A 01/11/2020   Procedure: COLONOSCOPY WITH PROPOFOL ;  Surgeon: Toledo, Ladell POUR, MD;  Location: [facility] END[facility]COPY;  Service: Gastroenterology;  Laterality: N/A;   CYSTECTOMY W/ CONTINENT DIVERSION     EYE SURGERY     THYROIDECTOMY      Home Medications:  Allergies as of 05/09/2024       Reactions   Statins Other (See Comments)   MUSCLE PAIN        Medication List        Accurate as of May 09, 2024  1:46 PM. If you have any questions, ask your nurse or doctor.          STOP taking these medications    latanoprost 0.005 % ophthalmic solution Commonly known as: XALATAN Stopped by: Glendia A Desarea [place]agan   metoprolol succinate 50 MG 24 hr tablet Commonly known as: TOPROL-XL Stopped by:  Glendia A Chaunta Bejarano   triamcinolone ointment 0.1 % Commonly known as: KENALOG Stopped by: Glendia A Makinzie Considine       TAKE these medications    albuterol 108 (90 Base) MCG/ACT inhaler Commonly known as: VENTOLIN HFA Inhale 1 puff into the lungs every 6 (six) hours as needed for wheezing or shortness of breath.   ascorbic acid 500 MG tablet Commonly known as: VITAMIN C Take 500 mg by mouth daily.   aspirin EC 81 MG tablet Take 81 mg by mouth daily. Swallow whole.   cholecalciferol 10 MCG (400 UNIT) Tabs tablet Commonly known as: VITAMIN D3 Take 400 Units by mouth daily.   dorzolamide-timolol 2-0.5 % ophthalmic solution Commonly known as: COSOPT Place 1 drop into both eyes 2 (two) times daily.   hydrochlorothiazide 25 MG tablet Commonly known as: HYDRODIURIL Take 25 mg by mouth daily.   levothyroxine 150 MCG tablet Commonly known as: SYNTHROID Take 150 mcg by mouth daily before breakfast.   losartan 100 MG tablet Commonly known as: COZAAR Take 100 mg by mouth daily.   multivitamin tablet Take 1 tablet by mouth daily.   Pfizer COVID-19 Vac Bivalent injection Generic drug: COVID-19 mRNA bivalent vaccine Proofreader) Inject into the muscle.   Pfizer-BioNT COVID-19 Vac-TriS Susp injection Generic drug: COVID-19  mRNA Vac-TriS (Pfizer) Inject into the muscle.   PROBIOTIC 10 ULTRA STRENGTH PO Take 100 mg by mouth daily.   sodium chloride  0.65 % nasal spray Commonly known as: OCEAN Place 1 spray into the nose as needed for congestion.        Allergies:  Allergies  Allergen Reactions   Statins Other (See Comments)    MUSCLE PAIN    Family History: Family History  Problem Relation Age of Onset   Breast cancer Mother 104   Breast cancer Father     Social History:  reports that she has never smoked. She has never used smokeless tobacco. She reports current alcohol use of about 3.0 standard drinks of alcohol per week. She reports that she does not use  drugs.  ROS:                                        Physical Exam: BP (!) 191/83 (BP Location: Left Arm, Patient Position: Sitting, Cuff Size: Normal)   Pulse 76   Ht 5' 6 (1.676 m)   Wt 84.4 kg   SpO2 97%   BMI 30.02 kg/m   Constitutional:  Alert and oriented, No acute distress. HEENT: [place] AT, moist mucus membranes.  Trachea midline, no masses. Cardiovascular: No clubbing, cyanosis, or edema. Respiratory: Normal respiratory effort, no increased work of breathing. GI: Abdomen is soft, nontender, nondistended, no abdominal masses GU: On pelvic examination patient had minimal hypermobility of the urethra with no stress incontinence.  She had a very small high grade 2 cystocele with atrophy Skin: No rashes, bruises or suspicious lesions. Lymph: No cervical or inguinal adenopathy. Neurologic: Grossly intact, no focal deficits, moving all 4 extremities. Psychiatric: Normal mood and affect.  Laboratory Data: No results found for: WBC, HGB, HCT, MCV, PLT  Lab Results  Component Value Date   CREATININE 0.90 04/28/2024    No results found for: PSA  No results found for: TESTOSTERONE  No results found for: HGBA1C  Urinalysis No results found for: COLORURINE, APPEARANCEUR, LABSPEC, PHURINE, GLUCOSEU, HGBUR, BILIRUBINUR, KETONESUR, PROTEINUR, UROBILINOGEN, NITRITE, LEUKOCYTESUR  Pertinent Imaging: Urine reviewed and sent for culture.  Chart reviewed  Assessment & Plan: Patient has primary urge incontinence and bedwetting.  I will try to help her without urodynamics.  Return for pelvic examination and cystoscopy.  Call if culture positive.  Gemtesa  samples and prescription given.  She does have some vaginal sensations likely from atrophy.  1. Urge incontinence (Primary)  - Urinalysis, Complete  2. Suspected UTI    No follow-ups on file.  Glendia DELENA Elizabeth, MD  [facility] [address], [address] [place], [place] [address] [phone]

## 2024-05-09 NOTE — Patient Instructions (Signed)

## 2024-05-12 LAB — CULTURE, URINE COMPREHENSIVE

## 2024-08-01 ENCOUNTER — Other Ambulatory Visit: Admitting: Urology

## 2024-09-19 ENCOUNTER — Other Ambulatory Visit: Admitting: Urology
